# Patient Record
Sex: Female | Born: 1985 | Race: White | Hispanic: Yes | Marital: Married | Smoking: Never smoker
Health system: Southern US, Community
[De-identification: ages and names within clinical notes are randomized; demographics above are authoritative.]

## PROBLEM LIST (undated history)

## (undated) DIAGNOSIS — F419 Anxiety disorder, unspecified: Secondary | ICD-10-CM

## (undated) DIAGNOSIS — R87629 Unspecified abnormal cytological findings in specimens from vagina: Secondary | ICD-10-CM

## (undated) DIAGNOSIS — F32A Depression, unspecified: Secondary | ICD-10-CM

## (undated) DIAGNOSIS — F329 Major depressive disorder, single episode, unspecified: Secondary | ICD-10-CM

## (undated) DIAGNOSIS — Z8619 Personal history of other infectious and parasitic diseases: Secondary | ICD-10-CM

## (undated) DIAGNOSIS — R519 Headache, unspecified: Secondary | ICD-10-CM

## (undated) DIAGNOSIS — J45909 Unspecified asthma, uncomplicated: Secondary | ICD-10-CM

## (undated) DIAGNOSIS — R51 Headache: Secondary | ICD-10-CM

## (undated) HISTORY — DX: Unspecified asthma, uncomplicated: J45.909

## (undated) HISTORY — DX: Headache: R51

## (undated) HISTORY — DX: Personal history of other infectious and parasitic diseases: Z86.19

## (undated) HISTORY — PX: TONSILLECTOMY: SUR1361

## (undated) HISTORY — PX: CRYOABLATION: SHX1415

## (undated) HISTORY — DX: Unspecified abnormal cytological findings in specimens from vagina: R87.629

## (undated) HISTORY — DX: Headache, unspecified: R51.9

---

## 2009-10-18 HISTORY — PX: OTHER SURGICAL HISTORY: SHX169

## 2010-08-26 ENCOUNTER — Ambulatory Visit (HOSPITAL_COMMUNITY): Admission: RE | Admit: 2010-08-26 | Discharge: 2010-08-28 | Payer: Self-pay | Admitting: Neurosurgery

## 2010-08-26 ENCOUNTER — Encounter (INDEPENDENT_AMBULATORY_CARE_PROVIDER_SITE_OTHER): Payer: Self-pay | Admitting: Neurosurgery

## 2010-12-29 LAB — CBC
HCT: 38.7 % (ref 36.0–46.0)
MCHC: 33.6 g/dL (ref 30.0–36.0)
MCV: 82.3 fL (ref 78.0–100.0)
RDW: 13.2 % (ref 11.5–15.5)

## 2014-07-25 LAB — OB RESULTS CONSOLE GBS: STREP GROUP B AG: POSITIVE

## 2014-07-25 LAB — OB RESULTS CONSOLE RUBELLA ANTIBODY, IGM: Rubella: IMMUNE

## 2014-07-25 LAB — OB RESULTS CONSOLE GC/CHLAMYDIA
Chlamydia: NEGATIVE
GC PROBE AMP, GENITAL: NEGATIVE

## 2014-07-25 LAB — OB RESULTS CONSOLE ABO/RH: RH TYPE: POSITIVE

## 2014-07-25 LAB — OB RESULTS CONSOLE RPR: RPR: NONREACTIVE

## 2014-07-25 LAB — OB RESULTS CONSOLE ANTIBODY SCREEN: ANTIBODY SCREEN: NEGATIVE

## 2014-07-25 LAB — OB RESULTS CONSOLE HIV ANTIBODY (ROUTINE TESTING): HIV: NONREACTIVE

## 2014-07-25 LAB — OB RESULTS CONSOLE HEPATITIS B SURFACE ANTIGEN: Hepatitis B Surface Ag: NEGATIVE

## 2014-08-12 ENCOUNTER — Other Ambulatory Visit: Payer: Self-pay | Admitting: Obstetrics and Gynecology

## 2014-08-14 LAB — CYTOLOGY - PAP

## 2014-10-17 ENCOUNTER — Inpatient Hospital Stay (HOSPITAL_COMMUNITY): Admission: AD | Admit: 2014-10-17 | Payer: Self-pay | Source: Ambulatory Visit | Admitting: Obstetrics and Gynecology

## 2015-03-06 ENCOUNTER — Telehealth (HOSPITAL_COMMUNITY): Payer: Self-pay | Admitting: *Deleted

## 2015-03-06 ENCOUNTER — Encounter (HOSPITAL_COMMUNITY): Payer: Self-pay | Admitting: *Deleted

## 2015-03-06 NOTE — Telephone Encounter (Signed)
Preadmission screen  

## 2015-03-11 ENCOUNTER — Inpatient Hospital Stay (HOSPITAL_COMMUNITY)
Admission: AD | Admit: 2015-03-11 | Discharge: 2015-03-13 | DRG: 775 | Disposition: A | Discharge status: Home / Self Care | Payer: BLUE CROSS/BLUE SHIELD | Source: Ambulatory Visit | Attending: Obstetrics and Gynecology | Admitting: Obstetrics and Gynecology

## 2015-03-11 ENCOUNTER — Inpatient Hospital Stay (HOSPITAL_COMMUNITY): Payer: BLUE CROSS/BLUE SHIELD | Admitting: Anesthesiology

## 2015-03-11 ENCOUNTER — Encounter (HOSPITAL_COMMUNITY): Payer: Self-pay | Admitting: *Deleted

## 2015-03-11 DIAGNOSIS — Z3A40 40 weeks gestation of pregnancy: Secondary | ICD-10-CM | POA: Diagnosis present

## 2015-03-11 DIAGNOSIS — O480 Post-term pregnancy: Secondary | ICD-10-CM | POA: Diagnosis present

## 2015-03-11 DIAGNOSIS — IMO0001 Reserved for inherently not codable concepts without codable children: Secondary | ICD-10-CM

## 2015-03-11 DIAGNOSIS — O99824 Streptococcus B carrier state complicating childbirth: Principal | ICD-10-CM | POA: Diagnosis present

## 2015-03-11 DIAGNOSIS — Z3490 Encounter for supervision of normal pregnancy, unspecified, unspecified trimester: Secondary | ICD-10-CM

## 2015-03-11 DIAGNOSIS — Z3483 Encounter for supervision of other normal pregnancy, third trimester: Secondary | ICD-10-CM | POA: Diagnosis present

## 2015-03-11 LAB — ABO/RH: ABO/RH(D): A POS

## 2015-03-11 LAB — CBC
HCT: 40.1 % (ref 36.0–46.0)
Hemoglobin: 14.3 g/dL (ref 12.0–15.0)
MCH: 30.6 pg (ref 26.0–34.0)
MCHC: 35.7 g/dL (ref 30.0–36.0)
MCV: 85.9 fL (ref 78.0–100.0)
Platelets: 180 K/uL (ref 150–400)
RBC: 4.67 MIL/uL (ref 3.87–5.11)
RDW: 14.4 % (ref 11.5–15.5)
WBC: 10.8 K/uL — ABNORMAL HIGH (ref 4.0–10.5)

## 2015-03-11 LAB — TYPE AND SCREEN
ABO/RH(D): A POS
Antibody Screen: NEGATIVE

## 2015-03-11 MED ORDER — BUTORPHANOL TARTRATE 1 MG/ML IJ SOLN
1.0000 mg | INTRAMUSCULAR | Status: DC | PRN
  Administered 2015-03-11: 1 mg via INTRAVENOUS
  Filled 2015-03-11: qty 1

## 2015-03-11 MED ORDER — FENTANYL 2.5 MCG/ML BUPIVACAINE 1/10 % EPIDURAL INFUSION (WH - ANES)
14.0000 mL/h | INTRAMUSCULAR | Status: DC | PRN
  Administered 2015-03-11 (×2): 14 mL/h via EPIDURAL
  Filled 2015-03-11: qty 125

## 2015-03-11 MED ORDER — OXYTOCIN BOLUS FROM INFUSION
500.0000 mL | INTRAVENOUS | Status: DC
  Administered 2015-03-11: 500 mL via INTRAVENOUS

## 2015-03-11 MED ORDER — ONDANSETRON HCL 4 MG PO TABS: 4.0000 mg | ORAL_TABLET | ORAL | Status: DC | PRN

## 2015-03-11 MED ORDER — LIDOCAINE HCL (PF) 1 % IJ SOLN
INTRAMUSCULAR | Status: DC | PRN
  Administered 2015-03-11 (×2): 4 mL

## 2015-03-11 MED ORDER — ACETAMINOPHEN 325 MG PO TABS: 650.0000 mg | ORAL_TABLET | ORAL | Status: DC | PRN

## 2015-03-11 MED ORDER — FENTANYL 2.5 MCG/ML BUPIVACAINE 1/10 % EPIDURAL INFUSION (WH - ANES): 14.0000 mL/h | INTRAMUSCULAR | Status: DC | PRN

## 2015-03-11 MED ORDER — DIPHENHYDRAMINE HCL 50 MG/ML IJ SOLN: 12.5000 mg | INTRAMUSCULAR | Status: DC | PRN

## 2015-03-11 MED ORDER — PHENYLEPHRINE 40 MCG/ML (10ML) SYRINGE FOR IV PUSH (FOR BLOOD PRESSURE SUPPORT)
80.0000 ug | PREFILLED_SYRINGE | INTRAVENOUS | Status: DC | PRN
  Filled 2015-03-11: qty 20
  Filled 2015-03-11: qty 2

## 2015-03-11 MED ORDER — OXYCODONE-ACETAMINOPHEN 5-325 MG PO TABS: 1.0000 | ORAL_TABLET | ORAL | Status: DC | PRN

## 2015-03-11 MED ORDER — PRENATAL MULTIVITAMIN CH
1.0000 | ORAL_TABLET | Freq: Every day | ORAL | Status: DC
  Administered 2015-03-12: 1 via ORAL
  Filled 2015-03-11: qty 1

## 2015-03-11 MED ORDER — IBUPROFEN 600 MG PO TABS
600.0000 mg | ORAL_TABLET | Freq: Four times a day (QID) | ORAL | Status: DC
  Administered 2015-03-11 – 2015-03-13 (×6): 600 mg via ORAL
  Filled 2015-03-11 (×6): qty 1

## 2015-03-11 MED ORDER — ONDANSETRON HCL 4 MG/2ML IJ SOLN: 4.0000 mg | Freq: Four times a day (QID) | INTRAMUSCULAR | Status: DC | PRN

## 2015-03-11 MED ORDER — SENNOSIDES-DOCUSATE SODIUM 8.6-50 MG PO TABS
2.0000 | ORAL_TABLET | ORAL | Status: DC
  Administered 2015-03-11 – 2015-03-12 (×2): 2 via ORAL
  Filled 2015-03-11 (×2): qty 2

## 2015-03-11 MED ORDER — EPHEDRINE 5 MG/ML INJ: 10.0000 mg | INTRAVENOUS | Status: DC | PRN

## 2015-03-11 MED ORDER — SIMETHICONE 80 MG PO CHEW: 80.0000 mg | CHEWABLE_TABLET | ORAL | Status: DC | PRN

## 2015-03-11 MED ORDER — PENICILLIN G POTASSIUM 5000000 UNITS IJ SOLR
2.5000 10*6.[IU] | INTRAMUSCULAR | Status: DC
  Administered 2015-03-11: 2.5 10*6.[IU] via INTRAVENOUS
  Filled 2015-03-11 (×8): qty 2.5

## 2015-03-11 MED ORDER — CITRIC ACID-SODIUM CITRATE 334-500 MG/5ML PO SOLN: 30.0000 mL | ORAL | Status: DC | PRN

## 2015-03-11 MED ORDER — WITCH HAZEL-GLYCERIN EX PADS
1.0000 "application " | MEDICATED_PAD | CUTANEOUS | Status: DC | PRN
  Administered 2015-03-12: 1 via TOPICAL

## 2015-03-11 MED ORDER — DIPHENHYDRAMINE HCL 25 MG PO CAPS: 25.0000 mg | ORAL_CAPSULE | Freq: Four times a day (QID) | ORAL | Status: DC | PRN

## 2015-03-11 MED ORDER — EPHEDRINE 5 MG/ML INJ
10.0000 mg | INTRAVENOUS | Status: DC | PRN
  Filled 2015-03-11: qty 2

## 2015-03-11 MED ORDER — OXYTOCIN 40 UNITS IN LACTATED RINGERS INFUSION - SIMPLE MED
62.5000 mL/h | INTRAVENOUS | Status: DC
  Filled 2015-03-11: qty 1000

## 2015-03-11 MED ORDER — LACTATED RINGERS IV SOLN: 500.0000 mL | INTRAVENOUS | Status: DC | PRN

## 2015-03-11 MED ORDER — ONDANSETRON HCL 4 MG/2ML IJ SOLN: 4.0000 mg | INTRAMUSCULAR | Status: DC | PRN

## 2015-03-11 MED ORDER — ZOLPIDEM TARTRATE 5 MG PO TABS: 5.0000 mg | ORAL_TABLET | Freq: Every evening | ORAL | Status: DC | PRN

## 2015-03-11 MED ORDER — LACTATED RINGERS IV SOLN
INTRAVENOUS | Status: DC
  Administered 2015-03-11: 125 mL/h via INTRAVENOUS
  Administered 2015-03-11: 10:00:00 via INTRAVENOUS

## 2015-03-11 MED ORDER — LIDOCAINE HCL (PF) 1 % IJ SOLN
30.0000 mL | INTRAMUSCULAR | Status: DC | PRN
  Filled 2015-03-11: qty 30

## 2015-03-11 MED ORDER — PHENYLEPHRINE 40 MCG/ML (10ML) SYRINGE FOR IV PUSH (FOR BLOOD PRESSURE SUPPORT)
80.0000 ug | PREFILLED_SYRINGE | INTRAVENOUS | Status: DC | PRN
Start: 2015-03-11 — End: 2015-03-11

## 2015-03-11 MED ORDER — LANOLIN HYDROUS EX OINT: TOPICAL_OINTMENT | CUTANEOUS | Status: DC | PRN

## 2015-03-11 MED ORDER — PENICILLIN G POTASSIUM 5000000 UNITS IJ SOLR
5.0000 10*6.[IU] | Freq: Once | INTRAVENOUS | Status: AC
  Administered 2015-03-11: 5 10*6.[IU] via INTRAVENOUS
  Filled 2015-03-11: qty 5

## 2015-03-11 MED ORDER — OXYCODONE-ACETAMINOPHEN 5-325 MG PO TABS: 2.0000 | ORAL_TABLET | ORAL | Status: DC | PRN

## 2015-03-11 MED ORDER — DIBUCAINE 1 % RE OINT
1.0000 "application " | TOPICAL_OINTMENT | RECTAL | Status: DC | PRN
  Administered 2015-03-12: 1 via RECTAL
  Filled 2015-03-11: qty 28

## 2015-03-11 MED ORDER — BENZOCAINE-MENTHOL 20-0.5 % EX AERO
1.0000 "application " | INHALATION_SPRAY | CUTANEOUS | Status: DC | PRN
  Administered 2015-03-11: 1 via TOPICAL
  Filled 2015-03-11: qty 56

## 2015-03-11 NOTE — Anesthesia Preprocedure Evaluation (Addendum)
Anesthesia Evaluation  Patient identified by MRN, date of birth, ID band Patient awake    Reviewed: Allergy & Precautions, H&P , NPO status , Patient's Chart, lab work & pertinent test results  Airway Mallampati: II  TM Distance: >3 FB Neck ROM: full    Dental  (+) Teeth Intact, Dental Advidsory Given   Pulmonary neg pulmonary ROS,  breath sounds clear to auscultation        Cardiovascular negative cardio ROS  Rhythm:regular Rate:Normal     Neuro/Psych  Headaches, negative psych ROS   GI/Hepatic negative GI ROS, Neg liver ROS,   Endo/Other  Morbid obesity  Renal/GU negative Renal ROS     Musculoskeletal   Abdominal   Peds  Hematology   Anesthesia Other Findings   Reproductive/Obstetrics (+) Pregnancy                            Anesthesia Physical Anesthesia Plan  ASA: III  Anesthesia Plan: Epidural   Post-op Pain Management:    Induction:   Airway Management Planned:   Additional Equipment:   Intra-op Plan:   Post-operative Plan:   Informed Consent: I have reviewed the patients History and Physical, chart, labs and discussed the procedure including the risks, benefits and alternatives for the proposed anesthesia with the patient or authorized representative who has indicated his/her understanding and acceptance.   Dental Advisory Given  Plan Discussed with: Anesthesiologist, CRNA and Surgeon  Anesthesia Plan Comments:        Anesthesia Quick Evaluation

## 2015-03-11 NOTE — H&P (Signed)
Jill Benton is a 29 y.o. female presenting for labor.  Pregnancy uncomplicated.  History OB History    Gravida Para Term Preterm AB TAB SAB Ectopic Multiple Living   2 1 1       1      Past Medical History  Diagnosis Date  . Vaginal Pap smear, abnormal   . Hx of varicella   . Asthma   . Headache    Past Surgical History  Procedure Laterality Date  . Cryoablation    . Tonsillectomy    . 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 Social History:  reports that she has never smoked. She has never used smokeless tobacco. She reports that she does not drink alcohol or use illicit drugs.   Prenatal Transfer Tool  Maternal Diabetes: No Genetic Screening: Declined Maternal Ultrasounds/Referrals: Normal Fetal Ultrasounds or other Referrals:  None Maternal Substance Abuse:  No Significant Maternal Medications:  None Significant Maternal Lab Results:  None Other Comments:  None  ROS  Dilation: 2.5 Effacement (%): 70 Station: -2 Exam by:: Dr Renaldo FiddlerAdkins Blood pressure 130/87, pulse 81, temperature 97.8 F (36.6 C), temperature source Oral, resp. rate 16, height 5' 3.25" (1.607 m), weight 207 lb (93.895 kg), last menstrual period 05/29/2014. Exam Physical Exam  Gen - uncomfortable w/ ctx Abd - gravid, NT  EFW 8.5# Ext - NT, trace edema bilaterally cvx 2.5/70/-2 AROM - clear Prenatal labs: ABO, Rh: --/--/A POS (05/24 [id number]) Antibody: NEG (05/24 0955) Rubella: Immune (10/08 0000) RPR: Nonreactive (10/08 0000)  HBsAg: Negative (10/08 0000)  HIV: Non-reactive (10/08 0000)  GBS: Positive (10/08 0000)   Assessment/Plan: Admit Epidural prn Exp mngt   Jill Benton 03/11/2015, 12:28 PM

## 2015-03-11 NOTE — Anesthesia Procedure Notes (Signed)
Epidural  Start time: 03/11/2015 1:02 PM End time: 03/11/2015 1:16 PM  Staffing Anesthesiologist: POTISEK, MELISSA Performed by: anesthesiologist   Preanesthetic Checklist Completed: patient identified, pre-op evaluation, timeout performed, IV checked, risks and benefits discussed and monitors and equipment checked  Epidural Patient position: sitting Prep: ChloraPrep Patient monitoring: blood pressure Approach: midline Location: L3-L4 Injection technique: LOR saline  Needle:  Needle type: Tuohy  Needle gauge: 17 G Needle length: 9 cm Needle insertion depth: 7 cm Catheter size: 19 Gauge Catheter at skin depth: 12 cm Test dose: negative and Other  Assessment Events: blood not aspirated, injection not painful, no injection resistance, negative IV test and no paresthesia  Additional Notes Informed consent obtained prior to proceeding including risk of failure, 1% risk of PDPH, risk of minor discomfort and bruising.  Discussed rare but serious complications including epidural abscess, permanent nerve injury, epidural hematoma.  Discussed alternatives to epidural analgesia and patient desires to proceed.  Timeout performed pre-procedure verifying patient name, procedure, and platelet count.  Patient tolerated procedure well.   SA test negative as confirmed by no motor block of hip abduction at 5 min post injection of 50 mg of lidocaine into the epidural catheter.  Bupivacaine 0.1% with fentanyl 2.315mcg/ml infused post procedure at 3212ml/hr with PCEA of 9ml every 10 mins.

## 2015-03-11 NOTE — MAU Note (Signed)
Some contractions last night. Went to bed, ctx's woke her this morning.  Some bloody show and mucous.  Was almost 2 yesterday. No problems with preg

## 2015-03-12 ENCOUNTER — Inpatient Hospital Stay (HOSPITAL_COMMUNITY): Admission: RE | Admit: 2015-03-12 | Payer: BLUE CROSS/BLUE SHIELD | Source: Ambulatory Visit

## 2015-03-12 LAB — HIV ANTIBODY (ROUTINE TESTING W REFLEX): HIV Screen 4th Generation wRfx: NONREACTIVE

## 2015-03-12 LAB — CBC
HEMATOCRIT: 32.4 % — AB (ref 36.0–46.0)
HEMOGLOBIN: 11 g/dL — AB (ref 12.0–15.0)
MCH: 29.4 pg (ref 26.0–34.0)
MCHC: 34 g/dL (ref 30.0–36.0)
MCV: 86.6 fL (ref 78.0–100.0)
PLATELETS: 176 10*3/uL (ref 150–400)
RBC: 3.74 MIL/uL — ABNORMAL LOW (ref 3.87–5.11)
RDW: 14.3 % (ref 11.5–15.5)
WBC: 11.3 10*3/uL — ABNORMAL HIGH (ref 4.0–10.5)

## 2015-03-12 LAB — RPR: RPR: NONREACTIVE

## 2015-03-12 NOTE — Anesthesia Postprocedure Evaluation (Signed)
  Anesthesia Post-op Note  Patient: Jill BurgerStephanie M Benton  Procedure(s) Performed: * No procedures listed *  Patient Location: PACU and Mother/Baby  Anesthesia Type:Epidural  Level of Consciousness: awake, alert  and oriented  Airway and Oxygen Therapy: Patient Spontanous Breathing  Post-op Pain: none  Post-op Assessment: Post-op Vital signs reviewed and Patient's Cardiovascular Status Stable  Post-op Vital Signs: Reviewed and stable  Last Vitals:  Filed Vitals:   03/12/15 0552  BP: 121/75  Pulse: 96  Temp: 37.1 C  Resp: 18    Complications: No apparent anesthesia complications

## 2015-03-12 NOTE — Addendum Note (Signed)
Addendum  created 03/12/15 1019 by Elgie CongoNataliya H Brendyn Mclaren, CRNA   Modules edited: Notes Section   Notes Section:  File: [id number]

## 2015-03-12 NOTE — Progress Notes (Signed)
Attended 'Well After Birth' group class which presented discharge care information for both Mom and Baby. 

## 2015-03-12 NOTE — Progress Notes (Signed)
Post Partum Day 1 Subjective: no complaints, up ad lib, voiding and tolerating PO  Objective: Blood pressure 121/75, pulse 96, temperature 98.7 F (37.1 C), temperature source Oral, resp. rate 18, height 5' 3.25" (1.607 m), weight 207 lb (93.895 kg), last menstrual period 05/29/2014, SpO2 99 %, unknown if currently breastfeeding.  Physical Exam:  General: alert and cooperative Lochia: appropriate Uterine Fundus: firm Incision: healing well DVT Evaluation: No evidence of DVT seen on physical exam. Negative Homan's sign. No cords or calf tenderness. No significant calf/ankle edema.   Recent Labs  03/11/15 0955 03/12/15 0555  HGB 14.3 11.0*  HCT 40.1 32.4*    Assessment/Plan: Plan for discharge tomorrow   LOS: 1 day   CURTIS,CAROL G 03/12/2015, 8:09 AM

## 2015-03-12 NOTE — Lactation Note (Signed)
This note was copied from the chart of Jill Benton Meras. Lactation Consultation Note  Patient Name: Jill Benton Noreen [id number]'s Date: 03/12/2015 Reason for consult: Initial assessment  Visited with Mom, baby 1721 hrs old.  Baby has latched and fed 10 times in 21 hrs.  This is Mom's 2nd child, 591st is 29 years old.  Baby cueing, so offered to assist with a feeding.  Mom wanted to try football hold, so assisted with positioning and latch.  Baby latches well, and suck/swallowed rhythmically without any discomfort.  Basics reviewed with Mom.  Brochure left with her at bedside.  Informed her of IP and OP lactation services available to her.  Encouraged skin to skin and cue based feedings.  To follow up in am, and prn.  Encouraged Discharge class today.       Consult Status Consult Status: Follow-up Date: 03/13/15 Follow-up type: In-patient    Judee ClaraSmith, Verginia Toohey E 03/12/2015, 1:16 PM

## 2015-03-13 MED ORDER — IBUPROFEN 600 MG PO TABS: 600.0000 mg | ORAL_TABLET | Freq: Four times a day (QID) | ORAL | Status: DC

## 2015-03-13 NOTE — Progress Notes (Signed)
Post Partum Day 2 Subjective: no complaints, up ad lib, voiding, tolerating PO and denies HA, blurred vision or RUQ pain  Objective: Blood pressure 116/85, pulse 83, temperature 98.3 F (36.8 C), temperature source Oral, resp. rate 18, height 5' 3.25" (1.607 m), weight 207 lb (93.895 kg), last menstrual period 05/29/2014, SpO2 99 %, unknown if currently breastfeeding.  Physical Exam:  General: alert and cooperative Lochia: appropriate Uterine Fundus: firm Incision: healing well DVT Evaluation: No evidence of DVT seen on physical exam. Negative Homan's sign. No cords or calf tenderness. No significant calf/ankle edema.   Recent Labs  03/11/15 0955 03/12/15 0555  HGB 14.3 11.0*  HCT 40.1 32.4*    Assessment/Plan: Discharge home. Reviewed signs and symptoms of PIH   LOS: 2 days   CURTIS,CAROL G 03/13/2015, 8:03 AM

## 2015-03-13 NOTE — Discharge Summary (Signed)
Obstetric Discharge Summary Reason for Admission: onset of labor Prenatal Procedures: ultrasound Intrapartum Procedures: spontaneous vaginal delivery Postpartum Procedures: none Complications-Operative and Postpartum: 1 degree perineal laceration HEMOGLOBIN  Date Value Ref Range Status  03/12/2015 11.0* 12.0 - 15.0 g/dL Final    Comment:    REPEATED TO VERIFY DELTA CHECK NOTED    HCT  Date Value Ref Range Status  03/12/2015 32.4* 36.0 - 46.0 % Final    Physical Exam:  General: alert and cooperative Lochia: appropriate Uterine Fundus: firm Incision: healing well DVT Evaluation: No evidence of DVT seen on physical exam. Negative Homan's sign. No cords or calf tenderness. No significant calf/ankle edema.  Discharge Diagnoses: Term Pregnancy-delivered  Discharge Information: Date: 03/13/2015 Activity: pelvic rest Diet: routine Medications: PNV and Ibuprofen Condition: stable Instructions: refer to practice specific booklet Discharge to: home   Newborn Data: Live born female  Birth Weight: 8 lb 8.7 oz (3875 g) APGAR: 9, 9  Home with mother.  CURTIS,CAROL G 03/13/2015, 8:05 AM

## 2015-03-13 NOTE — Progress Notes (Addendum)
Late entry:  Delivery done 03/11/15  SVD of vigorous female infant w/ apgars of 9,9.  Placenta delivered spontaneous w/ 3VC.   1st degree lac repaired w/ 3-0 vicryl rapide.  Fundus firm.  EBL 500cc .

## 2015-04-17 ENCOUNTER — Other Ambulatory Visit: Payer: Self-pay | Admitting: Obstetrics and Gynecology

## 2015-04-18 LAB — CYTOLOGY - PAP

## 2015-05-15 ENCOUNTER — Ambulatory Visit (HOSPITAL_COMMUNITY)
Admission: RE | Admit: 2015-05-15 | Discharge: 2015-05-15 | Disposition: A | Discharge status: Home / Self Care | Payer: BLUE CROSS/BLUE SHIELD | Source: Ambulatory Visit | Attending: Obstetrics and Gynecology | Admitting: Obstetrics and Gynecology

## 2015-05-15 NOTE — Lactation Note (Signed)
Lactation Consult  Mother's reason for visit:  Baby is 75 weeks old and not staying attached.  He also has only gained 9 oz in the past 5 weeks.  Visit Type:  OP  Appointment Notes:   [name] has been having difficulty staying attached to breast for the past week. He pops on and off of the breast. This is often seen when baby is not getting the desired flow.  When Mom performed breast compression he stayed on better though long jaw excursions were not seen.  When an occasional long excursion was seen he broke the seal and came off of the breast.  His total transfer on the left side was 2.2 oz and 1 oz on the right side. Mom post pumped 10 ml and fed it to [name].  He fell asleep after this.   Mom has PCOS but had an adequate supply until recently.  Suspect that the supply and feeding challenges are related to tongue function.  Oral evaluation:  Top lip does not flange well, tongue extends and lateralizes but does not elevate, This is affecting breast compression and vacuum.  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  Mom reports that she has never felt strong "tugs" when feeding him.  Mom was given instructions on increasing her MS.  Any expressed BM is to be fed back to [place].  Instructed to breast feed him 2 times more in 24.  She will follow-up with pediatrician tomorrow.  Resources were given to mom so that she can educate herself on oral restrictions.  Consult:  Initial Lactation Consultant:  Soyla Dryer  ________________________________________________________________________  Joan Flores Name: Jill Benton Date of Birth: 03/11/2015 Pediatrician: Marcial Pacas Gender: female Gestational Age: [redacted]w[redacted]d (At Birth) Birth Weight: 8 lb 8.7 oz (3875 g) Weight at Discharge: Weight: 8 lb 3.6 oz (3730 g)Date of Discharge: 03/13/2015 [facility] Weights   03/11/15 1537 03/12/15 0000 03/12/15 2331  Weight: 8 lb 8.7 oz (3875 g) 8 lb 6.6 oz (3815  g) 8 lb 3.6 oz (3730 g)   Last weight taken from location outside of [facility]: 11# Location:Pediatrician's office Weight today: 11# 9.9 oz     ________________________________________________________________________  Mother's Name: Sherryll Burger Type of delivery:   Breastfeeding Experience:  6 mos Maternal Medical Conditions:  Polycystic ovarian syndrome   ________________________________________________________________________  Breastfeeding History (Post Discharge)  Frequency of breastfeeding:  7 times in 24 hours Duration of feeding:  20-30 min    Infant Intake and Output Assessment  Voids:  6+ in 24 hrs.  Color:  Clear yellow Stools:  2 in 24 hrs.  Color:  Yellow  ________________________________________________________________________

## 2015-12-19 ENCOUNTER — Other Ambulatory Visit: Payer: Self-pay | Admitting: Surgery

## 2015-12-19 DIAGNOSIS — R1032 Left lower quadrant pain: Secondary | ICD-10-CM

## 2017-02-10 ENCOUNTER — Encounter (HOSPITAL_COMMUNITY): Payer: Self-pay | Admitting: Family Medicine

## 2017-02-10 ENCOUNTER — Ambulatory Visit (HOSPITAL_COMMUNITY)
Admission: EM | Admit: 2017-02-10 | Discharge: 2017-02-10 | Disposition: A | Discharge status: Home / Self Care | Payer: BLUE CROSS/BLUE SHIELD | Attending: Internal Medicine | Admitting: Internal Medicine

## 2017-02-10 DIAGNOSIS — T782XXA Anaphylactic shock, unspecified, initial encounter: Secondary | ICD-10-CM | POA: Diagnosis not present

## 2017-02-10 DIAGNOSIS — T886XXA Anaphylactic reaction due to adverse effect of correct drug or medicament properly administered, initial encounter: Secondary | ICD-10-CM | POA: Diagnosis not present

## 2017-02-10 MED ORDER — EPINEPHRINE PF 1 MG/ML IJ SOLN
INTRAMUSCULAR | Status: AC
  Filled 2017-02-10: qty 1

## 2017-02-10 MED ORDER — PREDNISONE 10 MG (21) PO TBPK: ORAL_TABLET | Freq: Every day | ORAL | 0 refills | Status: DC

## 2017-02-10 MED ORDER — METHYLPREDNISOLONE SODIUM SUCC 125 MG IJ SOLR
INTRAMUSCULAR | Status: AC
  Filled 2017-02-10: qty 2

## 2017-02-10 MED ORDER — EPINEPHRINE PF 1 MG/ML IJ SOLN
0.1000 mg | Freq: Once | INTRAMUSCULAR | Status: AC
  Administered 2017-02-10: 0.1 mg via INTRAMUSCULAR

## 2017-02-10 MED ORDER — METHYLPREDNISOLONE SODIUM SUCC 125 MG IJ SOLR
125.0000 mg | Freq: Once | INTRAMUSCULAR | Status: AC
  Administered 2017-02-10: 125 mg via INTRAVENOUS

## 2017-02-10 MED ORDER — FAMOTIDINE 20 MG PO TABS
40.0000 mg | ORAL_TABLET | Freq: Once | ORAL | Status: AC
  Administered 2017-02-10: 40 mg via ORAL

## 2017-02-10 MED ORDER — FAMOTIDINE 20 MG PO TABS
ORAL_TABLET | ORAL | Status: AC
  Filled 2017-02-10: qty 2

## 2017-02-10 MED ORDER — DIPHENHYDRAMINE HCL 50 MG/ML IJ SOLN
25.0000 mg | Freq: Once | INTRAMUSCULAR | Status: AC
  Administered 2017-02-10: 25 mg via INTRAVENOUS

## 2017-02-10 MED ORDER — DIPHENHYDRAMINE HCL 50 MG/ML IJ SOLN
INTRAMUSCULAR | Status: AC
  Filled 2017-02-10: qty 1

## 2017-02-10 NOTE — ED Notes (Signed)
PT instructed to start prednisone tomorrow. PT instructed to go to ED for new or worsening symptoms. PT instructed to use epipen if needed.

## 2017-02-10 NOTE — ED Provider Notes (Signed)
[facility]    CSN: [id number] Arrival date & time: 02/10/17  1709     History   Chief Complaint No chief complaint on file.   HPI Jill Benton is a 31 y.o. female.   This is a 31 year old woman who began getting allergy shots 2 months ago from Dr. Madie Reno. This afternoon she got another in the series, by one hour prior to arrival, and then left the office to head home. On the way she developed tightness in her chest, diffuse itching, and extreme shortness of breath. She has an EpiPen in her car but came directly to the urgent care center where she was rapidly triaged and given 1 mg of epinephrine subcutaneous. She had almost immediate relief.  She then developed urticaria with diffuse itching and wheals throughout her body. She's never had a reaction like this before      Past Medical History:  Diagnosis Date  . Asthma   . Headache   . Hx of varicella   . Vaginal Pap smear, abnormal     Patient Active Problem List   Diagnosis Date Noted  . Active labor at term 03/11/2015  . Pregnancy 03/11/2015    Past Surgical History:  Procedure Laterality Date  . CRYOABLATION    . excision of spinal tumor  2011   schwannoma  . TONSILLECTOMY      OB History    Gravida Para Term Preterm AB Living   SAB TAB Ectopic Multiple Live Births         0 2       Home Medications    Prior to Admission medications   Medication Sig Start Date End Date Taking? Authorizing Provider  ibuprofen (ADVIL,MOTRIN) 600 MG tablet Take 1 tablet (600 mg total) by mouth every 6 (six) hours. 03/13/15   Julio Sicks, NP  Prenatal Vit-Fe Fumarate-FA (PRENATAL MULTIVITAMIN) TABS tablet Take 1 tablet by mouth daily at 12 noon.    Historical Provider, MD    Family History Family History  Problem Relation Age of Onset  . Thyroid disease Mother   . Diabetes Father   . Cancer Paternal Aunt     breast  . Cancer Maternal Grandfather     lung  . Cancer Paternal  Grandfather     lung    Social History Social History  Substance Use Topics  . Smoking status: Never Smoker  . Smokeless tobacco: Never Used  . Alcohol use No     Allergies   Other   Review of Systems Review of Systems  Constitutional: Positive for diaphoresis.  Respiratory: Positive for choking, chest tightness, shortness of breath and wheezing.   Skin: Positive for rash.  Neurological: Negative.      Physical Exam Triage Vital Signs ED Triage Vitals  Enc Vitals Group     BP      Pulse      Resp      Temp      Temp src      SpO2      Weight      Height      Head Circumference      Peak Flow      Pain Score      Pain Loc      Pain Edu?      Excl. in GC?    No data found.   Updated Vital Signs BP 134/84 (BP Location: Left Arm)  Pulse (!) 113 Comment: notified md  Temp 98.5 F (36.9 C) (Oral)   SpO2 100%   Visual Acuity Right Eye Distance:   Left Eye Distance:   Bilateral Distance:    Right Eye Near:   Left Eye Near:    Bilateral Near:     Physical Exam  Constitutional: She is oriented to person, place, and time. She appears well-developed and well-nourished. She appears distressed.  HENT:  Right Ear: External ear normal.  Left Ear: External ear normal.  Mouth/Throat: Oropharynx is clear and moist.  Eyes: Conjunctivae and EOM are normal. Pupils are equal, round, and reactive to light.  Neck: Normal range of motion. Neck supple.  Cardiovascular: Normal rate, regular rhythm and normal heart sounds.   Pulmonary/Chest: She is in respiratory distress. She has wheezes.  Abdominal: Soft. Bowel sounds are normal.  Musculoskeletal: Normal range of motion.  Neurological: She is alert and oriented to person, place, and time.  Skin: Skin is warm and dry.  Diffuse urticaria  Nursing note and vitals reviewed.    UC Treatments / Results  Labs (all labs ordered are listed, but only abnormal results are displayed) Labs Reviewed - No data to  display  EKG  EKG Interpretation None       Radiology No results found.  Procedures Procedures (including critical care time)  Medications Ordered in UC Medications  methylPREDNISolone sodium succinate (SOLU-MEDROL) 125 mg/2 mL injection 125 mg (125 mg Intravenous Given 02/10/17 1732)  famotidine (PEPCID) tablet 40 mg (40 mg Oral Given 02/10/17 1739)  EPINEPHrine (ADRENALIN) 0.1 mg (0.1 mg Intramuscular Given 02/10/17 1726)  diphenhydrAMINE (BENADRYL) injection 25 mg (25 mg Intravenous Given 02/10/17 1747)     Initial Impression / Assessment and Plan / UC Course  I have reviewed the triage vital signs and the nursing notes.  Pertinent labs & imaging results that were available during my care of the patient were reviewed by me and considered in my medical decision making (see chart for details).     Feeling remarkably better following epi and solumedrol. Benadryl IV ordered by Dr. Sheryle Hail for urticaria and nasal congestion.  Husband to drive patient home.  Final Clinical Impressions(s) / UC Diagnoses   Final diagnoses:  Anaphylaxis, initial encounter    New Prescriptions New Prescriptions   No medications on file     Elvina Sidle, MD 02/10/17 1610

## 2017-02-10 NOTE — ED Provider Notes (Addendum)
Assumed care of patient for Dr. Milus Glazier.  Patient doing well after epi, Solumedrol, iv benadryl, and pepcid po.  Monitored in clinic prior to discharge. CTAB, nl effort.  Airway widely patent. Mild nasal congestion. Rx for Prednisone taper.   Arnaldo Natal, MD 02/10/17 Mallie Snooks    Arnaldo Natal, MD 02/10/17 [phone]

## 2017-02-10 NOTE — ED Triage Notes (Signed)
Patient arrived in triage with complaints of itching, swelling in throat.  Patient had received an allergy shot approx 2 hours ago.  Notified dr Milus Glazier immediately.

## 2018-04-05 ENCOUNTER — Other Ambulatory Visit: Payer: Self-pay

## 2018-04-05 ENCOUNTER — Encounter (HOSPITAL_BASED_OUTPATIENT_CLINIC_OR_DEPARTMENT_OTHER): Payer: Self-pay | Admitting: Emergency Medicine

## 2018-04-05 ENCOUNTER — Emergency Department (HOSPITAL_BASED_OUTPATIENT_CLINIC_OR_DEPARTMENT_OTHER): Payer: 59

## 2018-04-05 ENCOUNTER — Emergency Department (HOSPITAL_BASED_OUTPATIENT_CLINIC_OR_DEPARTMENT_OTHER)
Admission: EM | Admit: 2018-04-05 | Discharge: 2018-04-05 | Disposition: A | Discharge status: Home / Self Care | Payer: 59 | Attending: Emergency Medicine | Admitting: Emergency Medicine

## 2018-04-05 DIAGNOSIS — Z3202 Encounter for pregnancy test, result negative: Secondary | ICD-10-CM | POA: Insufficient documentation

## 2018-04-05 DIAGNOSIS — M542 Cervicalgia: Secondary | ICD-10-CM | POA: Diagnosis present

## 2018-04-05 DIAGNOSIS — Z79899 Other long term (current) drug therapy: Secondary | ICD-10-CM | POA: Diagnosis not present

## 2018-04-05 DIAGNOSIS — J45909 Unspecified asthma, uncomplicated: Secondary | ICD-10-CM | POA: Insufficient documentation

## 2018-04-05 DIAGNOSIS — M436 Torticollis: Secondary | ICD-10-CM | POA: Diagnosis not present

## 2018-04-05 HISTORY — DX: Major depressive disorder, single episode, unspecified: F32.9

## 2018-04-05 HISTORY — DX: Depression, unspecified: F32.A

## 2018-04-05 HISTORY — DX: Anxiety disorder, unspecified: F41.9

## 2018-04-05 LAB — PREGNANCY, URINE: Preg Test, Ur: NEGATIVE

## 2018-04-05 MED ORDER — NAPROXEN 500 MG PO TABS: 500.0000 mg | ORAL_TABLET | Freq: Two times a day (BID) | ORAL | 0 refills | Status: AC

## 2018-04-05 MED ORDER — OXYCODONE-ACETAMINOPHEN 5-325 MG PO TABS: 1.0000 | ORAL_TABLET | ORAL | 0 refills | Status: AC | PRN

## 2018-04-05 MED ORDER — ORPHENADRINE CITRATE ER 100 MG PO TB12: 100.0000 mg | ORAL_TABLET | Freq: Two times a day (BID) | ORAL | 0 refills | Status: AC

## 2018-04-05 NOTE — ED Notes (Signed)
Patient transported to CT 

## 2018-04-05 NOTE — ED Triage Notes (Signed)
Neck pain since Sunday, denies recent injury

## 2018-04-05 NOTE — Discharge Instructions (Signed)
Apply ice several times a day.  Take acetaminophen for additional pain relief. Reserve oxycodone-acetaminophen for at night if pain is keeping your from sleeping.

## 2018-04-05 NOTE — ED Provider Notes (Signed)
[facility] EMERGENCY DEPARTMENT Provider Note   CSN: [id number] Arrival date & time: 04/05/18  0158     History   Chief Complaint No chief complaint on file.   HPI Jill Benton is a 32 y.o. female.   The history is provided by the patient.  She has history of asthma and schwannoma and comes in complaining of left-sided neck pain for the last few days.  She woke up in the morning with stiffness and pain in that side of her neck.  It has persisted and actually gotten worse.  She but the pain at 8/10.  Pain is worse with head movement, better if she holds her head still.  She has tried taking acetaminophen without relief.  She denies weakness, numbness, tingling.  She denies any bowel or bladder dysfunction.  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  Past Medical History:  Diagnosis Date  . Asthma   . Headache   . Hx of varicella   . Vaginal Pap smear, abnormal     Patient Active Problem List   Diagnosis Date Noted  . Active labor at term 03/11/2015  . Pregnancy 03/11/2015    Past Surgical History:  Procedure Laterality Date  . CRYOABLATION    . excision of spinal tumor  2011   schwannoma  . TONSILLECTOMY       OB History    Gravida  2   Para  2   Term  2   Preterm      AB      Living  2     SAB      TAB      Ectopic      Multiple  0   Live Births  2            Home Medications    Prior to Admission medications   Medication Sig Start Date End Date Taking? Authorizing Provider  ibuprofen (ADVIL,MOTRIN) 600 MG tablet Take 1 tablet (600 mg total) by mouth every 6 (six) hours. 03/13/15   Julio Sicks, NP  predniSONE (STERAPRED UNI-PAK 21 TAB) 10 MG (21) TBPK tablet Take by mouth daily. Take 6 tabs by mouth daily  for 2 days, then 5 tabs for 2 days, then 4 tabs for 2 days, then 3 tabs for 2 days, 2 tabs for 2 days, then 1 tab by mouth daily for 2 days 02/10/17    Arnaldo Natal, MD  Prenatal Vit-Fe Fumarate-FA (PRENATAL MULTIVITAMIN) TABS tablet Take 1 tablet by mouth daily at 12 noon.    [provider]    Family History Family History  Problem Relation Age of Onset  . Thyroid disease Mother   . Diabetes Father   . Cancer Paternal Aunt        breast  . Cancer Maternal Grandfather        lung  . Cancer Paternal Grandfather        lung    Social History Social History   Tobacco Use  . Smoking status: Never Smoker  . Smokeless tobacco: Never Used  Substance Use Topics  . Alcohol use: No  . Drug use: No     Allergies   Other   Review of Systems Review of Systems  All other systems reviewed and are negative.    Physical Exam Updated Vital Signs BP 132/89 (BP Location: Right Arm)   Pulse (!) 104  Temp 97.7 F (36.5 C) (Oral)   Resp 20   Ht 5\' 5"  (1.651 m)   Wt 95.3 kg (210 lb)   SpO2 100%   BMI 34.95 kg/m    Physical Exam  Nursing note and vitals reviewed.  32 year old female, resting comfortably and in no acute distress. Vital signs are significant for borderline tachycardia. Oxygen saturation is 100%, which is normal. Head is normocephalic and atraumatic. PERRLA, EOMI. Oropharynx is clear. Neck is mildly tender along the left paracervical musculature.  Full passive range of motion is present, but pain is elicited with passive range of motion.  There is no adenopathy or JVD. Back is nontender and there is no CVA tenderness. Lungs are clear without rales, wheezes, or rhonchi. Chest is nontender. Heart has regular rate and rhythm without murmur. Abdomen is soft, flat, nontender without masses or hepatosplenomegaly and peristalsis is normoactive. Extremities have no cyanosis or edema, full range of motion is present. Skin is warm and dry without rash. Neurologic: Mental status is normal, cranial nerves are intact, there are no motor or sensory deficits.  Strength in arms is 5/5 in all muscle groups.   There is no pronator drift.  Sensation to pinprick is intact throughout the arms.  There is no extinction on double simultaneous stimulation.  ED Treatments / Results  Labs (all labs ordered are listed, but only abnormal results are displayed) Labs Reviewed  PREGNANCY, URINE    Radiology Ct Cervical Spine Wo Contrast  Result Date: 04/05/2018 CLINICAL DATA:  32 y/o F; torticollis and severe neck pain for 4 days. EXAM: CT CERVICAL SPINE WITHOUT CONTRAST TECHNIQUE: Multidetector CT imaging of the cervical spine was performed without intravenous contrast. Multiplanar CT image reconstructions were also generated. COMPARISON:  None. FINDINGS: Alignment: Normal. Skull base and vertebrae: No acute fracture. No primary bone lesion or focal pathologic process. Soft tissues and spinal canal: No prevertebral fluid or swelling. No visible canal hematoma. Disc levels: No significant loss of disc space height, bony foraminal stenosis, or bony canal stenosis. Upper chest: Negative. Other: None. IMPRESSION: No acute process identified.  Unremarkable CT of the cervical spine. Electronically Signed   By: Mitzi HansenLance  Furusawa-Stratton M.D.   On: 04/05/2018 03:23    Procedures Procedures  Medications Ordered in ED Medications - No data to display   Initial Impression / Assessment and Plan / ED Course  I have reviewed the triage vital signs and the nursing notes.  Pertinent labs & imaging results that were available during my care of the patient were reviewed by me and considered in my medical decision making (see chart for details).  Neck pain which is clinically torticollis.  I have discussed this with the patient, but she is concerned because of the missed diagnosis of her nerve sheath tumor in the past.  Therefore, she is sent for CT of the cervical spine to make sure there is no evidence of any tumor.  Old records are reviewed confirming removal of schwannoma in 2011.  CT scan shows no acute process, no  evidence of any tumor.  She is discharged with prescriptions for naproxen, orphenadrine, oxycodone-acetaminophen.  Advised to apply ice, use acetaminophen for additional pain relief, reserve oxycodone-acetaminophen for use at bedtime if she is unable to sleep because of pain.  Final Clinical Impressions(s) / ED Diagnoses   Final diagnoses:  Torticollis, acute    ED Discharge Orders        Ordered    naproxen (NAPROSYN) 500 MG tablet  2 times daily     04/05/18 0337    orphenadrine (NORFLEX) 100 MG tablet  2 times daily     04/05/18 0337    oxyCODONE-acetaminophen (PERCOCET) 5-325 MG tablet  Every 4 hours PRN     04/05/18 0339      Dione Booze, MD 04/05/18 [phone]

## 2019-07-31 IMAGING — CT CT CERVICAL SPINE W/O CM
3 of 4 series · 13 of 33 positions shown, 16 images · non-contrast
Comparison: None.

CLINICAL DATA: 31 y/o F; torticollis and severe neck pain for 4
days.

EXAM:
CT CERVICAL SPINE WITHOUT CONTRAST
TECHNIQUE: Multidetector CT imaging of the cervical spine was performed without
intravenous contrast. Multiplanar CT image reconstructions were also
generated.

[Series 7: sagittal bone · sagittal · 0.23mm/px · 5 of 36 slices shown, 6 images]
[im 12/36  bone]
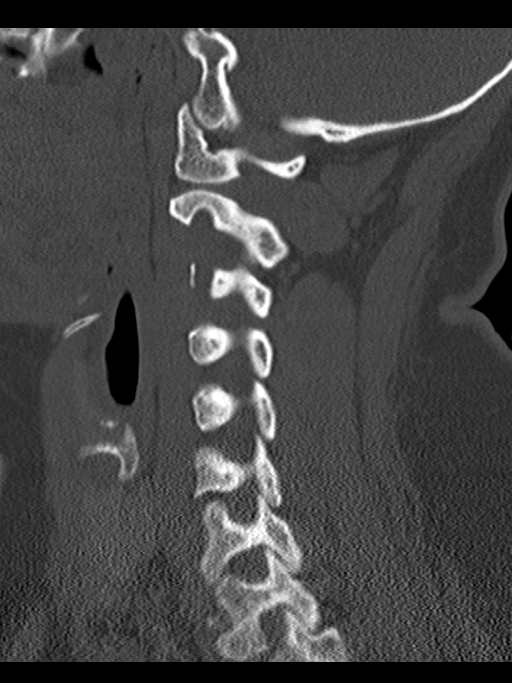
[im 15/36  bone]
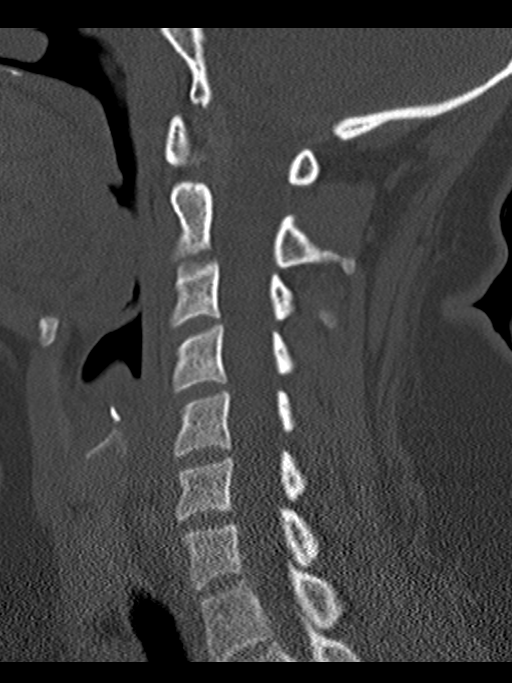
[im 18/36  soft-tissue]
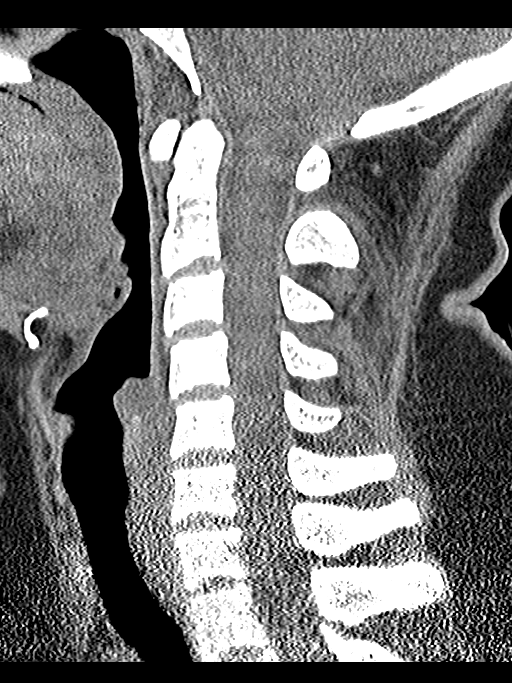
[im 18/36  bone]
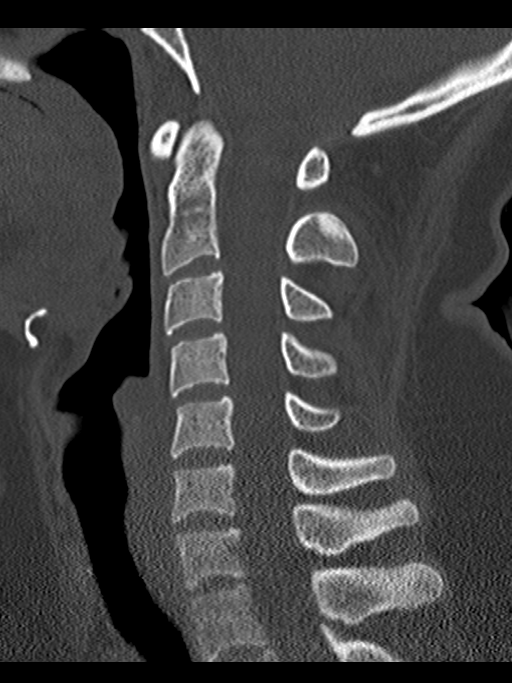
[im 21/36  bone]
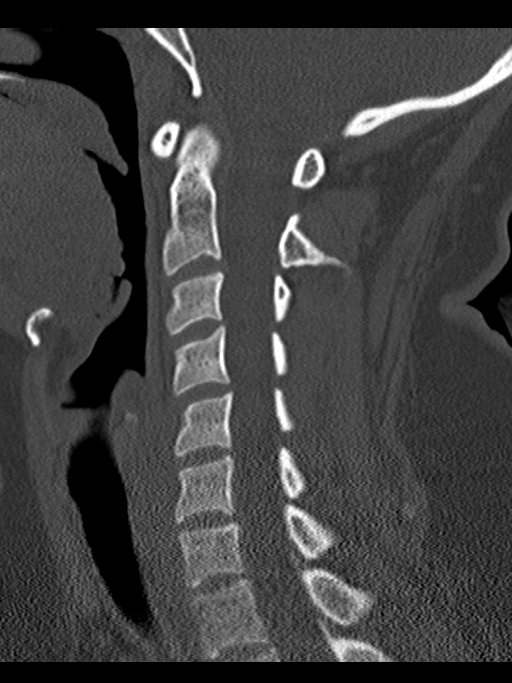
[im 24/36  bone]
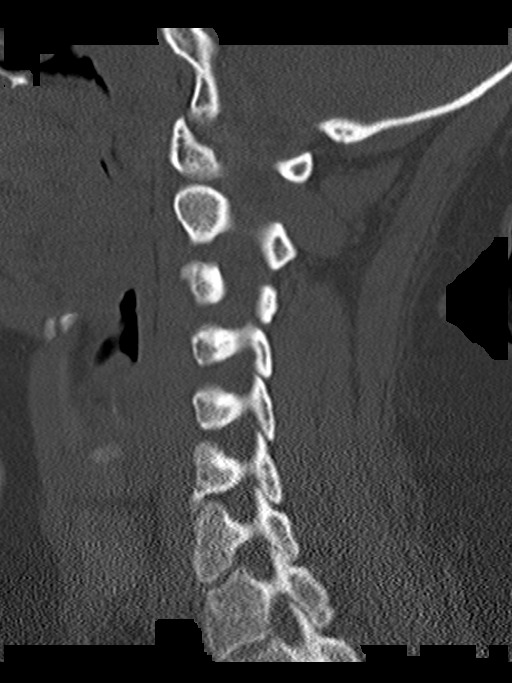

[Series 8: coronal bone · coronal · 0.24mm/px · 3 of 38 slices shown]
[im 8/38  bone]
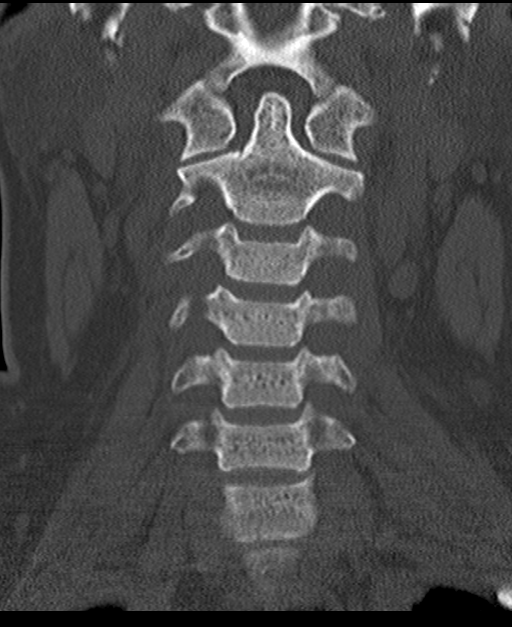
[im 15/38  bone]
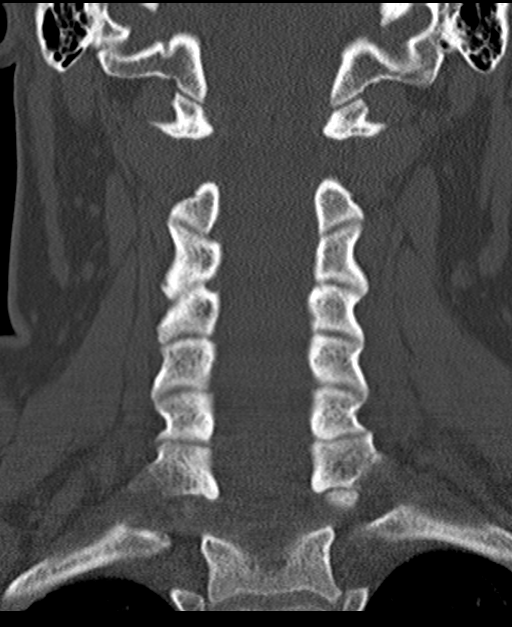
[im 23/38  bone]
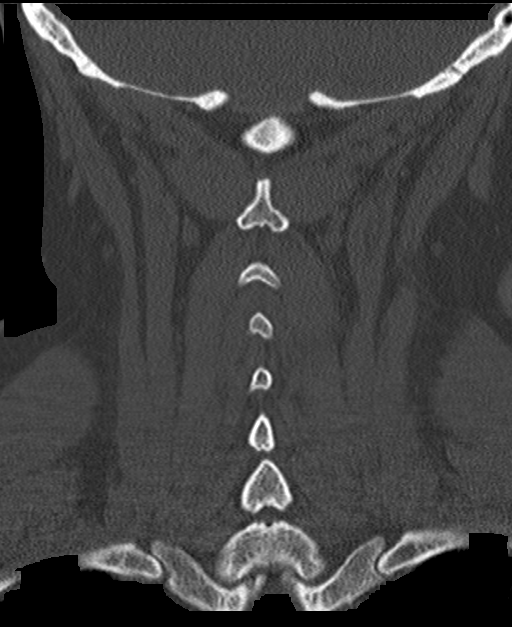

[Series 9: orthogonal bone · axial · 0.24mm/px · z∈[-160,-64]mm · 5 of 73 slices shown, 7 images]
[im 13/73  soft-tissue]
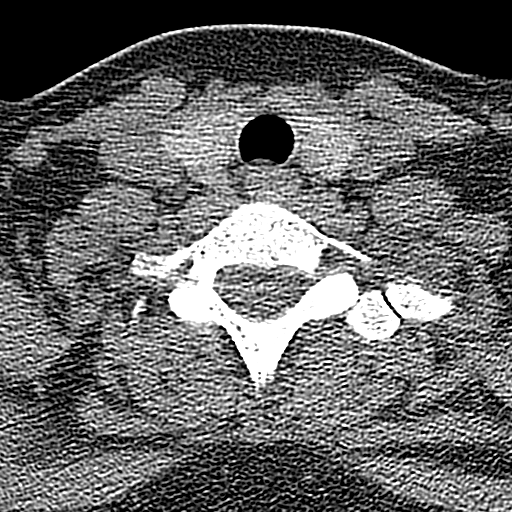
[im 13/73  bone]
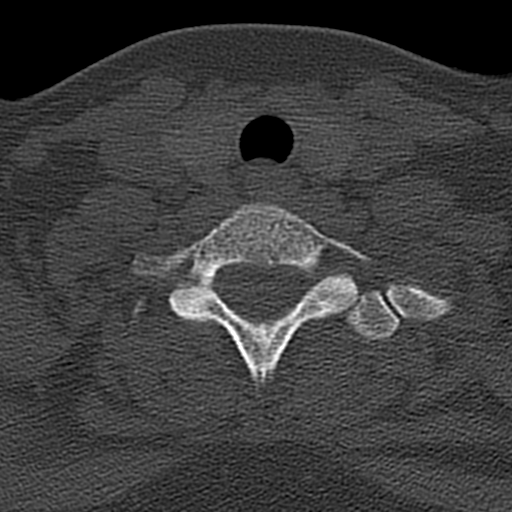
[im 25/73  bone]
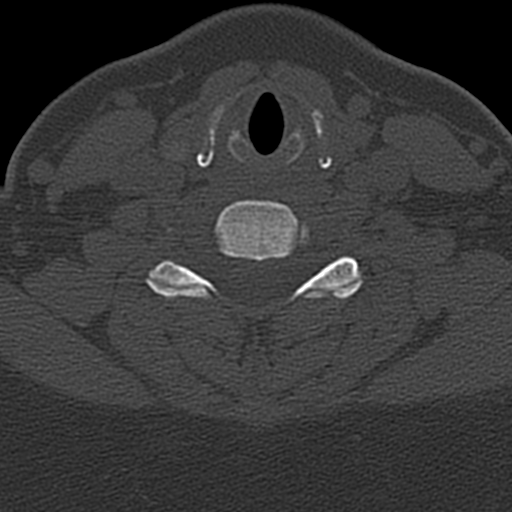
[im 37/73  bone]
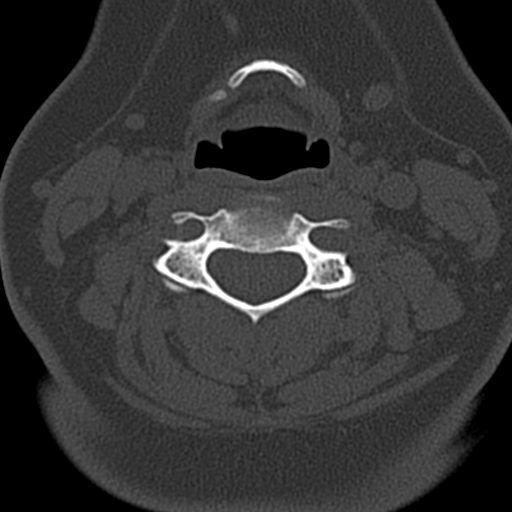
[im 49/73  bone]
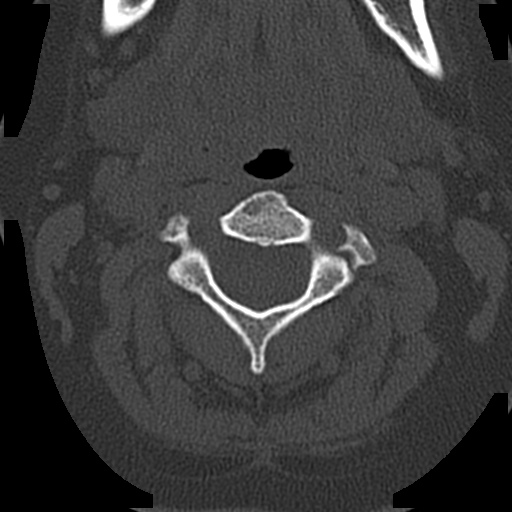
[im 61/73  soft-tissue]
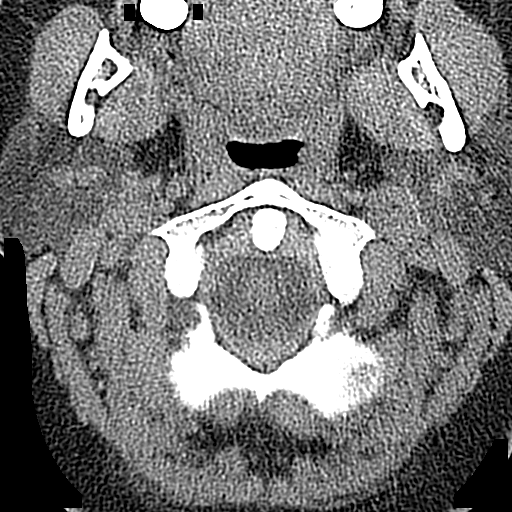
[im 61/73  bone]
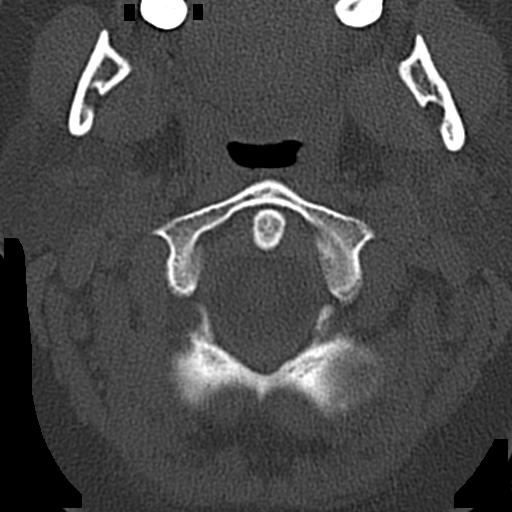

[13 of 33 positions shown; findings below may reference images not displayed]

FINDINGS: Alignment: Normal.

Skull base and vertebrae: No acute fracture. No primary bone lesion
or focal pathologic process.

Soft tissues and spinal canal: No prevertebral fluid or swelling. No
visible canal hematoma.

Disc levels: No significant loss of disc space height, bony
foraminal stenosis, or bony canal stenosis.

Upper chest: Negative.

Other: None.
IMPRESSION: No acute process identified.  Unremarkable CT of the cervical spine.

By: Omijie Soko M.D.
